# Patient Record
Sex: Male | Born: 1955 | Race: White | Hispanic: No | Marital: Married | Smoking: Never smoker
Health system: Southern US, Community
[De-identification: ages and names within clinical notes are randomized; demographics above are authoritative.]

## PROBLEM LIST (undated history)

## (undated) DIAGNOSIS — J189 Pneumonia, unspecified organism: Secondary | ICD-10-CM

## (undated) DIAGNOSIS — I514 Myocarditis, unspecified: Secondary | ICD-10-CM

## (undated) DIAGNOSIS — I10 Essential (primary) hypertension: Secondary | ICD-10-CM

## (undated) HISTORY — PX: CYST REMOVAL HAND: SHX6279

## (undated) HISTORY — PX: SKIN CANCER EXCISION: SHX779

## (undated) HISTORY — PX: VASECTOMY: SHX75

## (undated) HISTORY — PX: KNEE SURGERY: SHX244

## (undated) HISTORY — PX: NOSE SURGERY: SHX723

---

## 2014-09-13 ENCOUNTER — Emergency Department
Admission: EM | Admit: 2014-09-13 | Discharge: 2014-09-13 | Disposition: A | Discharge status: Home / Self Care | Payer: Managed Care, Other (non HMO) | Source: Home / Self Care | Attending: Family Medicine | Admitting: Family Medicine

## 2014-09-13 DIAGNOSIS — S058X1A Other injuries of right eye and orbit, initial encounter: Secondary | ICD-10-CM

## 2014-09-13 DIAGNOSIS — S0511XA Contusion of eyeball and orbital tissues, right eye, initial encounter: Secondary | ICD-10-CM

## 2014-09-13 MED ORDER — SULFACETAMIDE SODIUM 10 % OP SOLN: 1.0000 [drp] | OPHTHALMIC | Status: AC

## 2014-09-13 NOTE — Discharge Instructions (Signed)
Apply cold compress for about 10 minutes, 3 to 4 times daily until symptoms resolve.   Eye Contusion An eye contusion is a deep bruise of the eye. This is often called a "black eye." Contusions are the result of an injury that caused bleeding under the skin. The contusion may turn blue, purple, or yellow. Minor injuries will give you a painless contusion, but more severe contusions may stay painful and swollen for a few weeks. If the eye contusion only involves the eyelids and tissues around the eye, the injured area will get better within a few days to weeks. However, eye contusions can be serious and affect the eyeball and sight. CAUSES   Blunt injury or trauma to the face or eye area.  A forehead injury that causes the blood under the skin to work its way down to the eyelids.  Rubbing the eyes due to irritation. SYMPTOMS   Swelling and redness around the eye.  Bruising around the eye.  Tenderness, soreness, or pain around the eye.  Blurry vision.  Tearing.  Eyeball redness. DIAGNOSIS  A diagnosis is usually based on a thorough exam of the eye and surrounding area. The eye must be looked at carefully to make sure it is not injured and to make sure nothing else will threaten your vision. A vision test may be done. An X-ray or computed tomography (CT) scan may be needed to determine if there are any associated injuries, such as broken bones (fractures). TREATMENT  If there is an injury to the eye, treatment will be determined by the nature of the injury. HOME CARE INSTRUCTIONS   Put ice on the injured area.  Put ice in a plastic bag.  Place a towel between your skin and the bag.  Leave the ice on for 15-20 minutes, 03-04 times a day.  If it is determined that there is no injury to the eye, you may continue normal activities.  Sunglasses may be worn to protect your eyes from bright light if light is uncomfortable.  Sleep with your head elevated. You can put an extra pillow  under your head. This may help with discomfort.  Only take over-the-counter or prescription medicines for pain, discomfort, or fever as directed by your caregiver. Do not take aspirin for the first few days. This may increase bruising. SEEK IMMEDIATE MEDICAL CARE IF:   You have any form of vision loss.  You have double vision.  You feel nauseous.  You feel dizzy, sleepy, or like you will faint.  You have any fluid discharge from the eye or your nose.  You have swelling and discoloration that does not fade. MAKE SURE YOU:   Understand these instructions.  Will watch your condition.  Will get help right away if you are not doing well or get worse. Document Released: 10/06/2000 Document Revised: 01/01/2012 Document Reviewed: 08/25/2011 [facility] Patient Information 2015 [place], [place]. This information is not intended to replace advice given to you by your health care provider. Make sure you discuss any questions you have with your health care provider.

## 2014-09-13 NOTE — ED Provider Notes (Signed)
CSN: [id number]     Arrival date & time 09/13/14  1503 History   First MD Initiated Contact with Patient 09/13/14 1634     Chief Complaint  Patient presents with  . Eye Injury     HPI Comments: Patient was doing yard work at dusk yesterday.  He walked between two trees and a branch struck his right face and eye.  He had initial swelling that had improved by this morning.  He denies changes in vision although he has had photophobia when outside.  Patient is a 58 y.o. male presenting with eye injury. The history is provided by the patient.  Eye Injury This is a new problem. The current episode started yesterday. The problem occurs constantly. The problem has been gradually improving. Associated symptoms comments: No blurred vision. Nothing aggravates the symptoms. Nothing relieves the symptoms. He has tried nothing for the symptoms.    No past medical history on file. No past surgical history on file. No family history on file. History  Substance Use Topics  . Smoking status: Not on file  . Smokeless tobacco: Not on file  . Alcohol Use: Not on file    Review of Systems  Eyes: Positive for photophobia and pain. Negative for discharge, redness, itching and visual disturbance.  All other systems reviewed and are negative.   Allergies  Review of patient's allergies indicates no known allergies.  Home Medications   Prior to Admission medications   Medication Sig Start Date End Date Taking? Authorizing Provider  atorvastatin (LIPITOR) 40 MG tablet Take 40 mg by mouth daily.   Yes Historical Provider, MD  ibuprofen (ADVIL,MOTRIN) 800 MG tablet Take 800 mg by mouth every 8 (eight) hours as needed.   Yes Historical Provider, MD  lisinopril (PRINIVIL,ZESTRIL) 40 MG tablet Take 40 mg by mouth daily.   Yes Historical Provider, MD  ranitidine (ZANTAC) 300 MG tablet Take 300 mg by mouth at bedtime.   Yes Historical Provider, MD  sulfacetamide (BLEPH-10) 10 % ophthalmic solution Place 1-2  drops into the right eye every 3 (three) hours. Use for 3 to 4 days until symptoms resolved. 09/13/14   Lattie HawStephen A Beese, MD   BP 124/74 mmHg  Pulse 72  Temp(Src) 97.9 F (36.6 C) (Oral)  Wt 245 lb (111.131 kg)  SpO2 98% Physical Exam  Constitutional: He appears well-developed and well-nourished. No distress.  HENT:  Head: Atraumatic.  Nose: Nose normal.  Mouth/Throat: Oropharynx is clear and moist.  Eyes: Conjunctivae and EOM are normal. Pupils are equal, round, and reactive to light. Lids are everted and swept, no foreign bodies found. Right eye exhibits no discharge. No foreign body present in the right eye. Left eye exhibits no discharge.    Right upper lid mildly tender to palpation, but no swelling or erythema noted.  Fundi benign.  Fluorescein to right eye reveals no uptake.  Visual Acuity - Bilateral Distance: 20 20 ; R Distance: 20 20 ; L Distance: 20 20    Nursing note and vitals reviewed.   ED Course  Procedures  none     MDM   1. Contusion of right eye, initial encounter; exam unremarkable    Begin sulfacetamide ophthalmic suspension. Apply cold compress for about 10 minutes, 3 to 4 times daily until symptoms resolve. Followup with ophthalmologist if not resolved 3 days.    Lattie HawStephen A Beese, MD 09/14/14 [phone]

## 2014-09-13 NOTE — ED Notes (Signed)
Right eye pain since last night, patient was doing yard work as it was getting dark, walked between two trees and believes a twig scratched his eye

## 2015-10-18 ENCOUNTER — Encounter: Payer: Self-pay | Admitting: Emergency Medicine

## 2015-10-18 ENCOUNTER — Emergency Department (INDEPENDENT_AMBULATORY_CARE_PROVIDER_SITE_OTHER): Payer: Managed Care, Other (non HMO)

## 2015-10-18 ENCOUNTER — Emergency Department
Admission: EM | Admit: 2015-10-18 | Discharge: 2015-10-18 | Disposition: A | Discharge status: Home / Self Care | Payer: Managed Care, Other (non HMO) | Source: Home / Self Care | Attending: Family Medicine | Admitting: Family Medicine

## 2015-10-18 DIAGNOSIS — J069 Acute upper respiratory infection, unspecified: Secondary | ICD-10-CM | POA: Diagnosis not present

## 2015-10-18 DIAGNOSIS — R062 Wheezing: Secondary | ICD-10-CM | POA: Diagnosis not present

## 2015-10-18 DIAGNOSIS — J0100 Acute maxillary sinusitis, unspecified: Secondary | ICD-10-CM

## 2015-10-18 DIAGNOSIS — R05 Cough: Secondary | ICD-10-CM | POA: Diagnosis not present

## 2015-10-18 HISTORY — DX: Myocarditis, unspecified: I51.4

## 2015-10-18 HISTORY — DX: Pneumonia, unspecified organism: J18.9

## 2015-10-18 HISTORY — DX: Essential (primary) hypertension: I10

## 2015-10-18 MED ORDER — BENZONATATE 100 MG PO CAPS: 100.0000 mg | ORAL_CAPSULE | Freq: Three times a day (TID) | ORAL | Status: AC

## 2015-10-18 MED ORDER — AMOXICILLIN-POT CLAVULANATE 875-125 MG PO TABS: 1.0000 | ORAL_TABLET | Freq: Two times a day (BID) | ORAL | Status: AC

## 2015-10-18 MED ORDER — BENZONATATE 100 MG PO CAPS: 100.0000 mg | ORAL_CAPSULE | Freq: Three times a day (TID) | ORAL | Status: DC

## 2015-10-18 MED ORDER — PREDNISONE 20 MG PO TABS: ORAL_TABLET | ORAL | Status: AC

## 2015-10-18 MED ORDER — ALBUTEROL SULFATE HFA 108 (90 BASE) MCG/ACT IN AERS: 1.0000 | INHALATION_SPRAY | Freq: Four times a day (QID) | RESPIRATORY_TRACT | Status: AC | PRN

## 2015-10-18 NOTE — Discharge Instructions (Signed)
You may take 400-600mg Ibuprofen (Motrin) every 6-8 hours for fever and pain  °Alternate with Tylenol  °You may take 500mg Tylenol every 4-6 hours as needed for fever and pain  °Follow-up with your primary care provider next week for recheck of symptoms if not improving.  °Be sure to drink plenty of fluids and rest, at least 8hrs of sleep a night, preferably more while you are sick. °Return urgent care or go to closest ER if you cannot keep down fluids/signs of dehydration, fever not reducing with Tylenol, difficulty breathing/wheezing, stiff neck, worsening condition, or other concerns (see below)  °Please take antibiotics as prescribed and be sure to complete entire course even if you start to feel better to ensure infection does not come back. ° ° °Cool Mist Vaporizers °Vaporizers may help relieve the symptoms of a cough and cold. They add moisture to the air, which helps mucus to become thinner and less sticky. This makes it easier to breathe and cough up secretions. Cool mist vaporizers do not cause serious burns like hot mist vaporizers, which may also be called steamers or humidifiers. Vaporizers have not been proven to help with colds. You should not use a vaporizer if you are allergic to mold. °HOME CARE INSTRUCTIONS °· Follow the package instructions for the vaporizer. °· Do not use anything other than distilled water in the vaporizer. °· Do not run the vaporizer all of the time. This can cause mold or bacteria to grow in the vaporizer. °· Clean the vaporizer after each time it is used. °· Clean and dry the vaporizer well before storing it. °· Stop using the vaporizer if worsening respiratory symptoms develop. °  °This information is not intended to replace advice given to you by your health care provider. Make sure you discuss any questions you have with your health care provider. °  °Document Released: 07/06/2004 Document Revised: 10/14/2013 Document Reviewed: 02/26/2013 °Elsevier Interactive Patient  Education ©2016 [employer] ° °

## 2015-10-18 NOTE — ED Provider Notes (Signed)
CSN: [id number]     Arrival date & time 10/18/15  1429 History   First MD Initiated Contact with Patient 10/18/15 1456     Chief Complaint  Patient presents with  . Cough  . Generalized Body Aches   (Consider location/radiation/quality/duration/timing/severity/associated sxs/prior Treatment) HPI [facility] 59[facility]o productive cough for 4 days with associated congestion, sinus pressure with frontal headaches, bilateral ear pain, chills, mild SOB with chest tightness pt believes due to his cough.  Reports starting Azithromycin 4 days ago but no relief. He has used guaifenesin with minimal relief. Reports having had pneumonia several years ago and states this feels similar. Denies hx of asthma but did have to use an inhaler as a child.  Denies n/v/d.  He did receive flu vaccine this year.  Past Medical History  Diagnosis Date  . Pneumonia   . Myocarditis (HCC)   . Hypertension    Past Surgical History  Procedure Laterality Date  . Nose surgery    . Knee surgery Bilateral   . Cyst removal hand    . Skin cancer excision    . Vasectomy     History reviewed. No pertinent family history. Social History  Substance Use Topics  . Smoking status: Never Smoker   . Smokeless tobacco: None  . Alcohol Use: No    Review of Systems  Constitutional: Positive for fever, chills and fatigue.  HENT: Positive for congestion, ear pain ( bilateral), postnasal drip, rhinorrhea, sinus pressure, sore throat and voice change. Negative for trouble swallowing.   Respiratory: Positive for cough, chest tightness, shortness of breath and wheezing.   Cardiovascular: Positive for chest pain. Negative for palpitations.  Gastrointestinal: Negative for nausea, vomiting, abdominal pain and diarrhea.  Musculoskeletal: Negative for myalgias, back pain and arthralgias.  Skin: Negative for rash.  Neurological: Positive for headaches. Negative for dizziness and light-headedness.  All other  systems reviewed and are negative.   Allergies  Review of patient's allergies indicates no known allergies.  Home Medications   Prior to Admission medications   Medication Sig Start Date End Date Taking? Authorizing Provider  azithromycin (ZITHROMAX) 1 G powder Take 1 g by mouth once.   Yes Historical Provider, MD  albuterol (PROVENTIL HFA;VENTOLIN HFA) 108 (90 BASE) MCG/ACT inhaler Inhale 1-2 puffs into the lungs every 6 (six) hours as needed for wheezing or shortness of breath. 10/18/15   Junius FinnerErin O'Malley, PA-C  amoxicillin-clavulanate (AUGMENTIN) 875-125 MG tablet Take 1 tablet by mouth 2 (two) times daily. One po bid x 7 days 10/18/15   Junius FinnerErin O'Malley, PA-C  atorvastatin (LIPITOR) 40 MG tablet Take 40 mg by mouth daily.    Historical Provider, MD  benzonatate (TESSALON) 100 MG capsule Take 1 capsule (100 mg total) by mouth every 8 (eight) hours. 10/18/15   Junius FinnerErin O'Malley, PA-C  ibuprofen (ADVIL,MOTRIN) 800 MG tablet Take 800 mg by mouth every 8 (eight) hours as needed.    Historical Provider, MD  lisinopril (PRINIVIL,ZESTRIL) 40 MG tablet Take 40 mg by mouth daily.    Historical Provider, MD  predniSONE (DELTASONE) 20 MG tablet 3 tabs po day one, then 2 po daily x 4 days 10/18/15   Junius FinnerErin O'Malley, PA-C  ranitidine (ZANTAC) 300 MG tablet Take 300 mg by mouth at bedtime.    Historical Provider, MD  sulfacetamide (BLEPH-10) 10 % ophthalmic solution Place 1-2 drops into the right eye every 3 (three) hours. Use for 3 to 4 days until symptoms resolved. 09/13/14  Lattie Haw, MD   Meds Ordered and Administered this Visit  Medications - No data to display  BP 116/66 mmHg  Pulse 92  Temp(Src) 98.4 F (36.9 C) (Oral)  Resp 18  Ht  (1.753 m)  Wt 237 lb (107.502 kg)  BMI 34.98 kg/m2  SpO2 99% No data found.   Physical Exam  Constitutional: He appears well-developed and well-nourished.  HENT:  Head: Normocephalic and atraumatic.  Right Ear: Hearing, tympanic membrane, external ear  and ear canal normal.  Left Ear: Hearing, tympanic membrane, external ear and ear canal normal.  Nose: Mucosal edema present. Right sinus exhibits maxillary sinus tenderness. Right sinus exhibits no frontal sinus tenderness. Left sinus exhibits maxillary sinus tenderness. Left sinus exhibits no frontal sinus tenderness.  Mouth/Throat: Uvula is midline and mucous membranes are normal. Posterior oropharyngeal erythema present. No oropharyngeal exudate, posterior oropharyngeal edema or tonsillar abscesses.  Eyes: Conjunctivae are normal. No scleral icterus.  Neck: Normal range of motion.  Cardiovascular: Normal rate, regular rhythm and normal heart sounds.   Pulmonary/Chest: Effort normal. No respiratory distress. He has wheezes ( faint expiratory wheeze in lower lung fields). He has no rales. He exhibits no tenderness.  No respiratory distress. Able to speak in full sentences.  Abdominal: Soft. Bowel sounds are normal. He exhibits no distension and no mass. There is no tenderness. There is no rebound and no guarding.  Musculoskeletal: Normal range of motion.  Neurological: He is alert.  Skin: Skin is warm and dry.  Nursing note and vitals reviewed.   ED Course  Procedures (including critical care time)  Labs Review Labs Reviewed - No data to display  Imaging Review Dg Chest 2 View  10/18/2015  CLINICAL DATA:  Acute cough and congestion with fever for 4 days EXAM: CHEST  2 VIEW COMPARISON:  None. FINDINGS: The heart size and mediastinal contours are within normal limits. Both lungs are clear. The visualized skeletal structures are unremarkable except for minor thoracic endplate degenerative changes and increased thoracic kyphosis. IMPRESSION: No active cardiopulmonary disease. Electronically Signed   By: Judie Petit.  Shick M.D.   On: 10/18/2015 14:56     MDM   1. Acute upper respiratory infection   2. Wheeze   3. Acute maxillary sinusitis, recurrence not specified    Pt c/o worsening URI  symptoms with cough, wheeze, and facial pressure.  Exam c/w maxillary sinusitis. Faint expiratory wheeze also noted on exam. O2 Sat 99% on RA  CXR: no active cardiopulmonary disease  Rx: augmentin, prednisone, tessalon, and albuterol  Advised pt to use acetaminophen and ibuprofen as needed for fever and pain. Encouraged rest and fluids. F/u with PCP in 7-10 days if not improving, sooner if worsening. Pt verbalized understanding and agreement with tx plan.     Junius Finner, PA-C 10/18/15 1536

## 2015-10-18 NOTE — ED Notes (Signed)
Patient reports 4 day history of cough, aches, ear pain, chills, hoarseness and shortness of breath. States saw pcp 4 days ago and was started on z-pack but has not improved and has history of pneumonia.

## 2015-10-24 ENCOUNTER — Telehealth: Payer: Self-pay | Admitting: Emergency Medicine

## 2017-01-09 IMAGING — CR DG CHEST 2V
2 series · 2 of 2 positions shown · non-contrast
Comparison: None.

CLINICAL DATA: Acute cough and congestion with fever for 4 days

EXAM:
CHEST  2 VIEW

[chest pa]
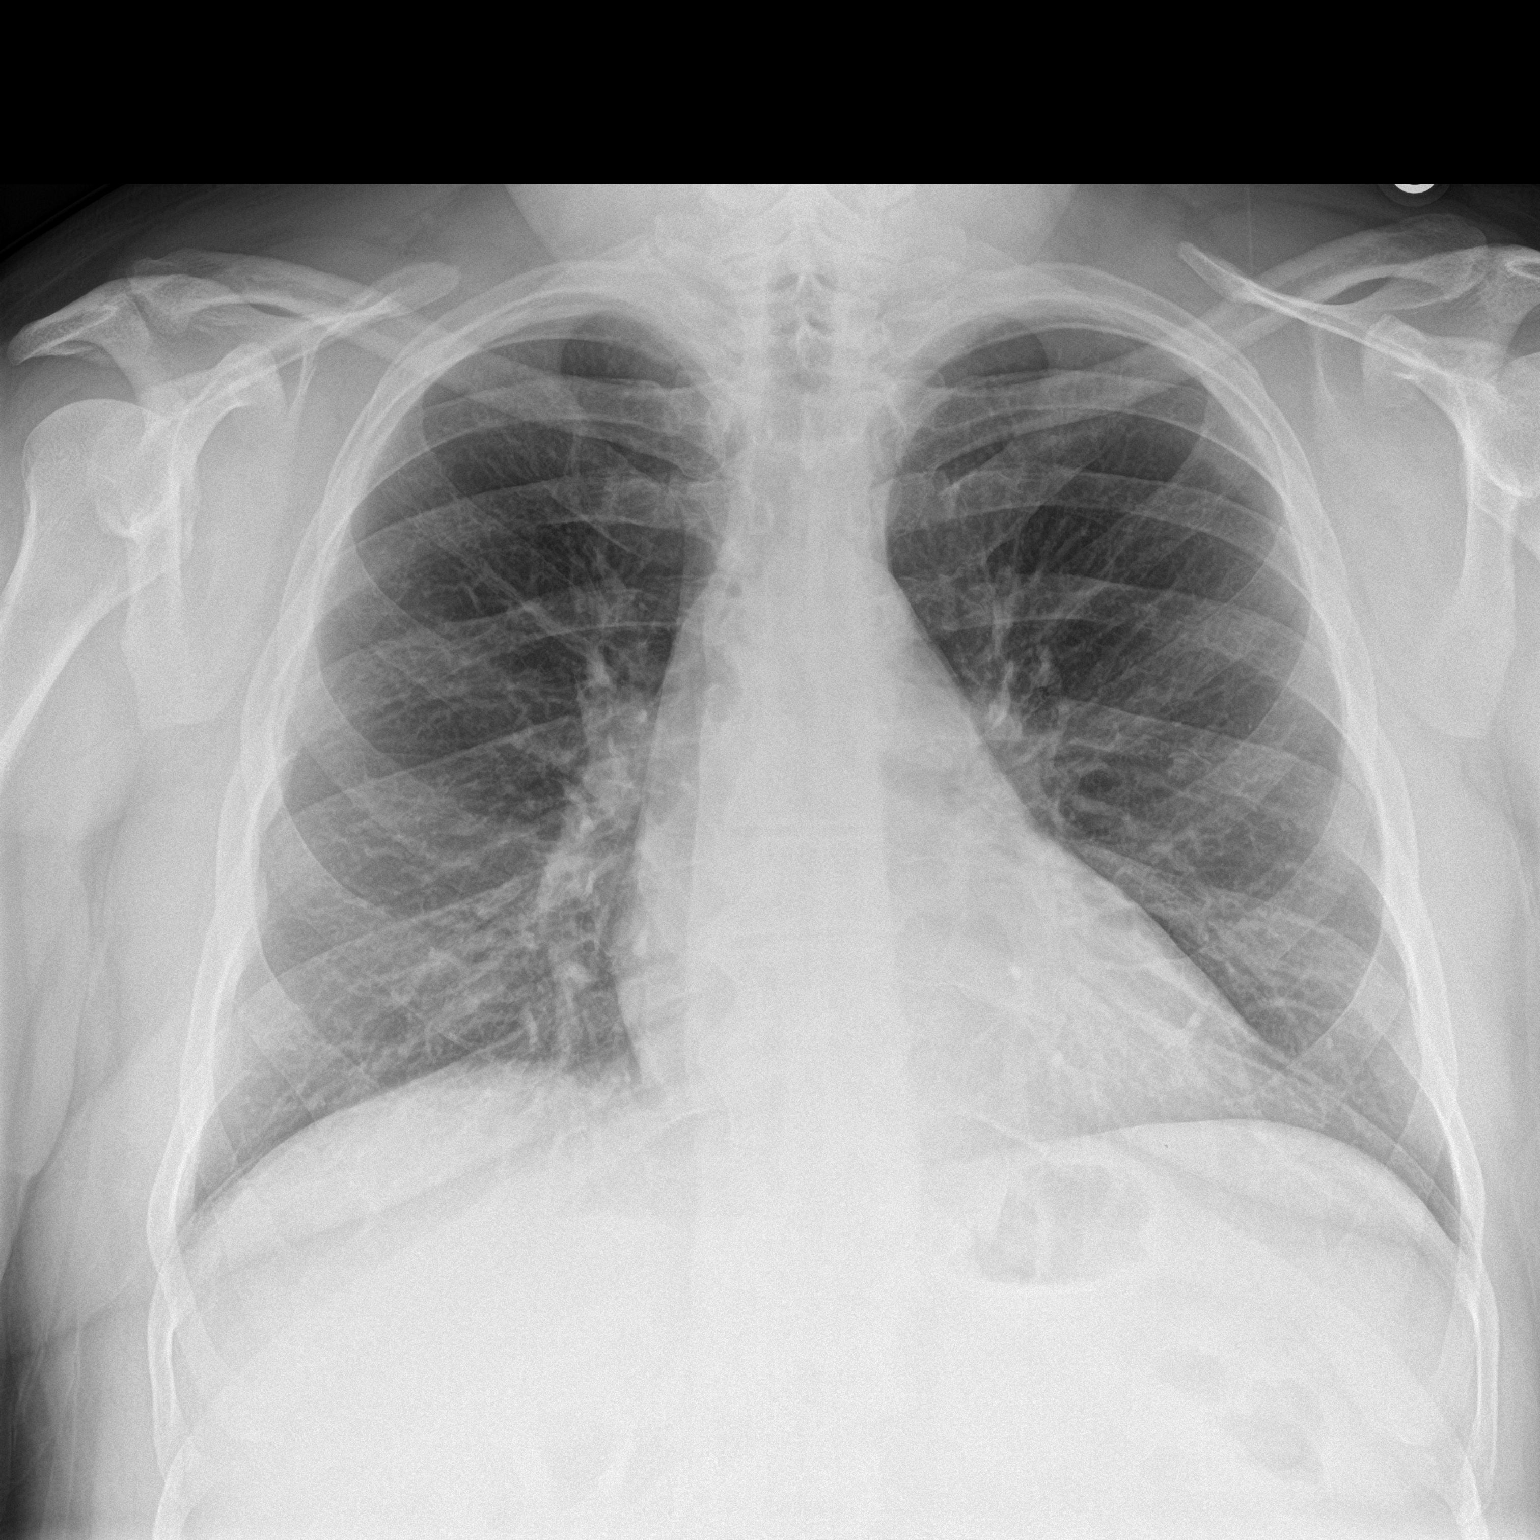

[chest lat]
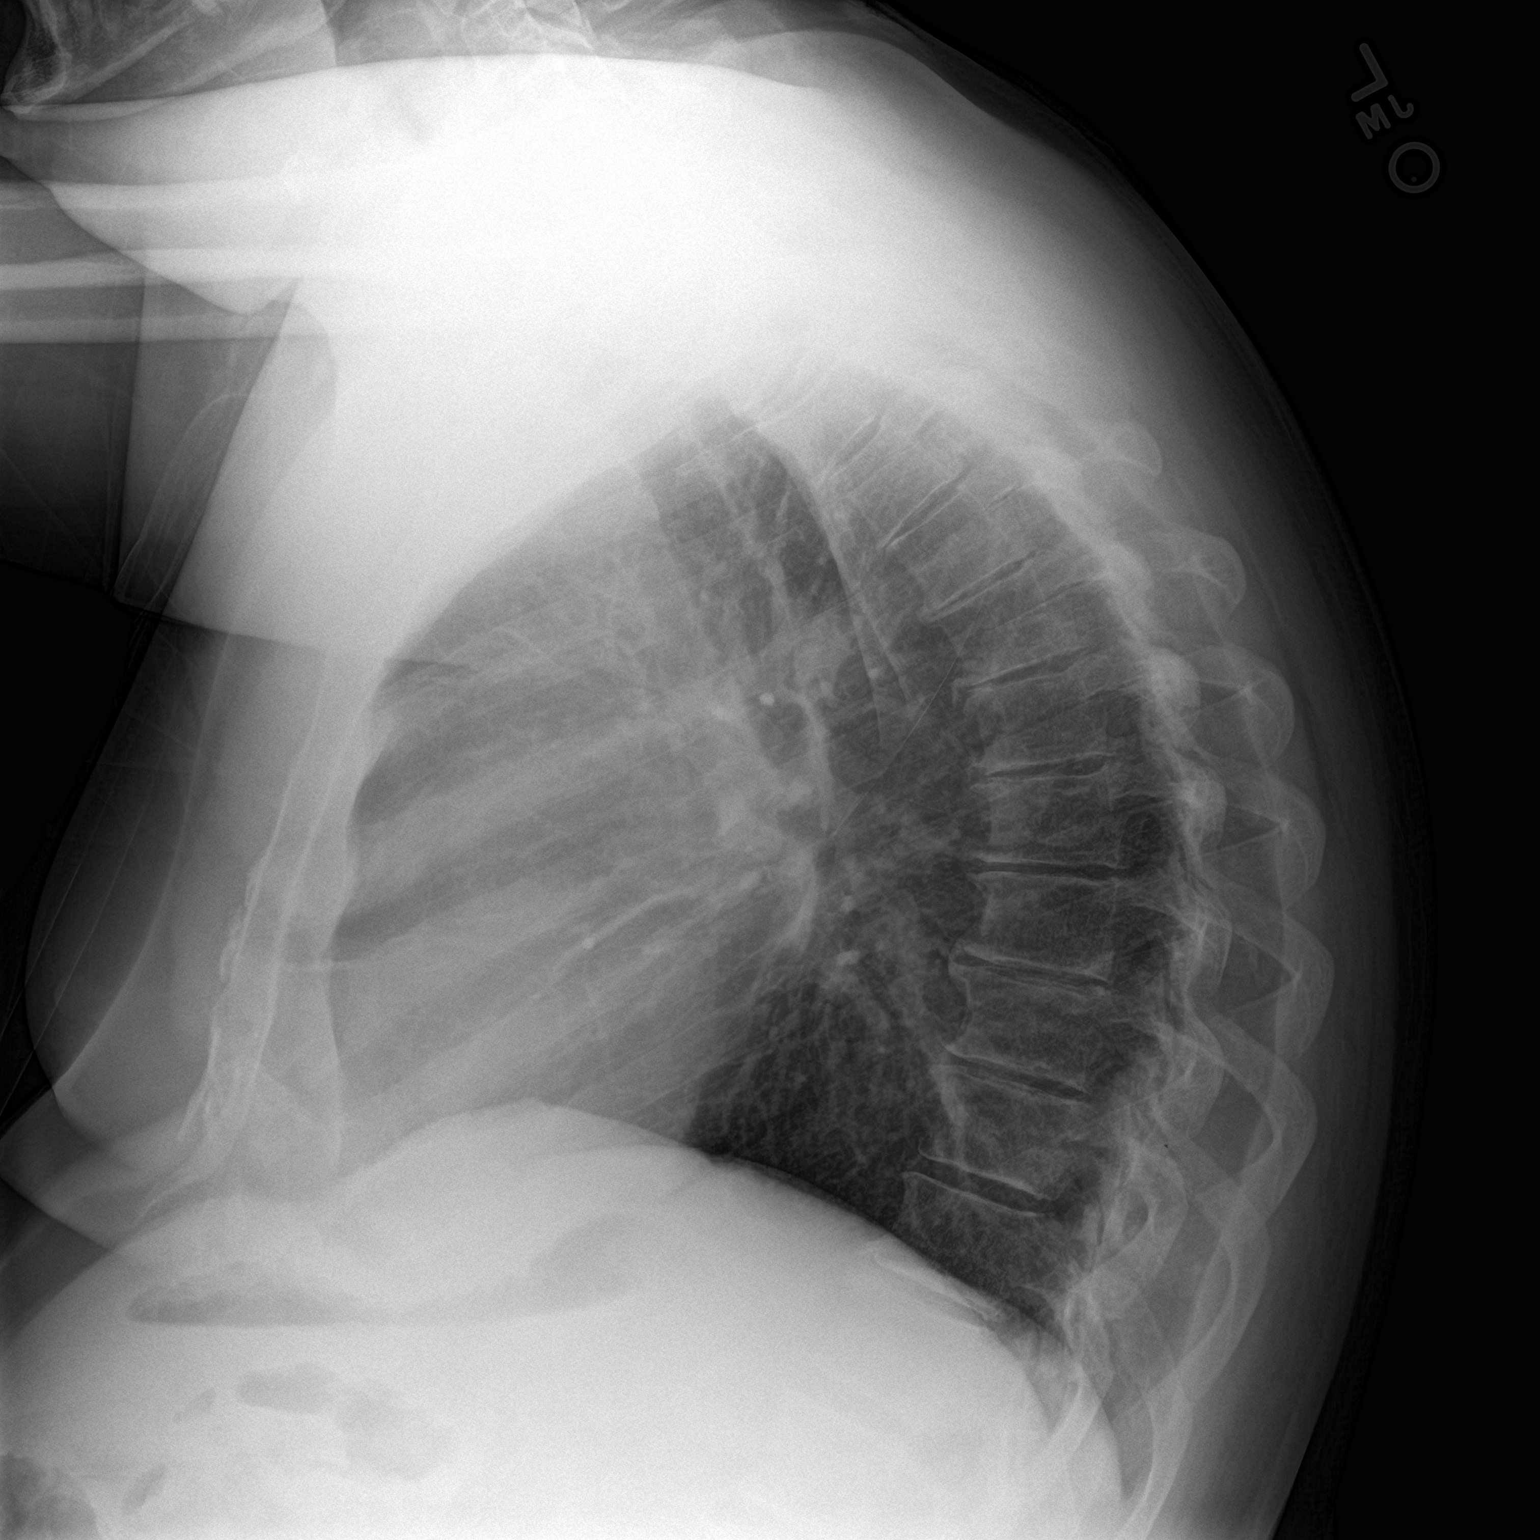

[2 of 2 positions shown; findings below may reference images not displayed]

FINDINGS: The heart size and mediastinal contours are within normal limits.
Both lungs are clear. The visualized skeletal structures are
unremarkable except for minor thoracic endplate degenerative changes
and increased thoracic kyphosis.
IMPRESSION: No active cardiopulmonary disease.
